# Patient Record
Sex: Female | Born: 1980 | Race: Black or African American | Hispanic: No | Marital: Single | Smoking: Current every day smoker
Health system: Southern US, Community
[De-identification: ages and names within clinical notes are randomized; demographics above are authoritative.]

---

## 2014-12-01 ENCOUNTER — Emergency Department (HOSPITAL_COMMUNITY)
Admission: EM | Admit: 2014-12-01 | Discharge: 2014-12-01 | Disposition: A | Discharge status: Home / Self Care | Payer: BC Managed Care – PPO | Attending: Emergency Medicine | Admitting: Emergency Medicine

## 2014-12-01 ENCOUNTER — Emergency Department (HOSPITAL_COMMUNITY): Payer: BC Managed Care – PPO

## 2014-12-01 ENCOUNTER — Encounter (HOSPITAL_COMMUNITY): Payer: Self-pay | Admitting: *Deleted

## 2014-12-01 DIAGNOSIS — R062 Wheezing: Secondary | ICD-10-CM | POA: Diagnosis present

## 2014-12-01 DIAGNOSIS — J069 Acute upper respiratory infection, unspecified: Secondary | ICD-10-CM | POA: Insufficient documentation

## 2014-12-01 DIAGNOSIS — Z720 Tobacco use: Secondary | ICD-10-CM | POA: Diagnosis not present

## 2014-12-01 MED ORDER — IPRATROPIUM BROMIDE 0.02 % IN SOLN
0.5000 mg | RESPIRATORY_TRACT | Status: AC
  Administered 2014-12-01: 0.5 mg via RESPIRATORY_TRACT
  Filled 2014-12-01 (×2): qty 2.5

## 2014-12-01 MED ORDER — PHENYLEPHRINE-DM-GG-APAP 5-10-200-325 MG PO CAPS: 2.0000 | ORAL_CAPSULE | Freq: Three times a day (TID) | ORAL | Status: AC

## 2014-12-01 MED ORDER — ALBUTEROL SULFATE HFA 108 (90 BASE) MCG/ACT IN AERS: 2.0000 | INHALATION_SPRAY | RESPIRATORY_TRACT | Status: AC | PRN

## 2014-12-01 MED ORDER — ALBUTEROL SULFATE (2.5 MG/3ML) 0.083% IN NEBU
5.0000 mg | INHALATION_SOLUTION | Freq: Once | RESPIRATORY_TRACT | Status: AC
  Administered 2014-12-01: 5 mg via RESPIRATORY_TRACT
  Filled 2014-12-01: qty 6

## 2014-12-01 NOTE — ED Provider Notes (Signed)
CSN: [id number]     Arrival date & time 12/01/14  1013 History   First MD Initiated Contact with Patient 12/01/14 1028     Chief Complaint  Patient presents with  . Wheezing  . Cough   (Consider location/radiation/quality/duration/timing/severity/associated sxs/prior Treatment) HPI  Kelly Marquez is a 34 year old female presenting with report of cough and wheezing. She states she first noticed the sore throat runny nose and nasal congestion 5 days ago. The following day she began having dry, nonproductive cough. The cough caused a burning sensation in her upper chest when she was coughing. She noticed 3 days ago that she is wheezing slightly. She currently is pain-free. She denies any fevers, chills, nausea, or vomiting.  History reviewed. No pertinent past medical history. History reviewed. No pertinent past surgical history. History reviewed. No pertinent family history. History  Substance Use Topics  . Smoking status: Current Every Day Smoker -- 0.50 packs/day for 10 years    Types: Cigarettes  . Smokeless tobacco: Not on file  . Alcohol Use: Yes   OB History    No data available     Review of Systems  Constitutional: Negative for fever and chills.  HENT: Positive for congestion and sore throat.   Respiratory: Positive for cough, chest tightness and wheezing. Negative for shortness of breath.   Gastrointestinal: Negative for nausea and vomiting.    Allergies  Review of patient's allergies indicates no known allergies.  Home Medications   Prior to Admission medications   Not on File   BP 142/87 mmHg  Pulse 72  Temp(Src) 98.2 F (36.8 C) (Oral)  Resp 18  SpO2 99% Physical Exam  Constitutional: She is oriented to person, place, and time. She appears well-developed and well-nourished. No distress.  HENT:  Head: Normocephalic and atraumatic.  Mouth/Throat: Oropharynx is clear and moist. No oropharyngeal exudate.  Eyes: Conjunctivae are normal.  Neck: Normal range of  motion. Neck supple.  Cardiovascular: Normal rate and regular rhythm.   Pulmonary/Chest: Effort normal. She has no decreased breath sounds. She has wheezes in the right upper field, the right middle field, the left upper field and the left middle field. She has no rhonchi. She has no rales.  Abdominal: Soft.  Neurological: She is alert and oriented to person, place, and time.  Skin: Skin is warm and dry. She is not diaphoretic.  Nursing note and vitals reviewed.   ED Course  Procedures (including critical care time) Labs Review Labs Reviewed - No data to display  Imaging Review Dg Chest 2 View  12/01/2014   CLINICAL DATA:  Initial encounter for cough and wheezing for 2 days. Smoker.  EXAM: CHEST  2 VIEW  COMPARISON:  None.  FINDINGS: Midline trachea.  Normal heart size and mediastinal contours.  Sharp costophrenic angles.  No pneumothorax.  Clear lungs.  Lateral view degraded by patient arm position. Mild S-shaped thoracolumbar spine curvature.  IMPRESSION: No active cardiopulmonary disease.   Electronically Signed   By: Jeronimo GreavesKyle  Talbot M.D.   On: 12/01/2014 11:59     EKG Interpretation None      MDM   Final diagnoses:  URI (upper respiratory infection)   34 yo with rhinorrhea, congestion, cough and wheezing  Her symptoms are consistent with viral URI.  CXR is negative for acute infiltrate. Wheezing improved after neb tx. Discussed that antibiotics are not indicated for viral infections. Pt will be discharged with symptomatic treatment. Return precautions provided. She verbalizes understanding and is agreeable with plan.  Filed Vitals:   12/01/14 1020 12/01/14 1053 12/01/14 1213  BP: 142/87  151/96  Pulse: 72  68  Temp: 98.2 F (36.8 C)    TempSrc: Oral    Resp: 18  18  SpO2: 99% 97% 100%   Meds given in ED:  Medications  albuterol (PROVENTIL) (2.5 MG/3ML) 0.083% nebulizer solution 5 mg (5 mg Nebulization Given 12/01/14 1053)  ipratropium (ATROVENT) nebulizer solution 0.5 mg  (0.5 mg Nebulization Given 12/01/14 1053)    Discharge Medication List as of 12/01/2014 12:08 PM    START taking these medications   Details  albuterol (PROVENTIL HFA;VENTOLIN HFA) 108 (90 BASE) MCG/ACT inhaler Inhale 2 puffs into the lungs every 4 (four) hours as needed for wheezing or shortness of breath., Starting 12/01/2014, Until Discontinued, Print    Phenylephrine-DM-GG-APAP (MUCINEX FAST-MAX) 5-10-200-325 MG CAPS Take 2 capsules by mouth 3 (three) times daily., Starting 12/01/2014, Until Discontinued, Print           Harle Battiest, NP 12/02/14 1215  Toy Cookey, MD 12/05/14 2020

## 2014-12-01 NOTE — ED Notes (Signed)
RT at bedside.

## 2014-12-01 NOTE — Discharge Instructions (Signed)
Please follow the directions provided. Use the resources guide provided to establish care with a primary care doctor for follow-up. Please use the albuterol inhaler 2 puffs every 4 hours as needed for cough and wheezing. Please use the multi symptom cold medicine to help with cough, congestion. Don't hesitate to return for any new, worsening, or concerning symptoms.   SEEK IMMEDIATE MEDICAL CARE IF:  You have a fever.  You develop severe or persistent headache, ear pain, sinus pain, or chest pain.  You develop wheezing, a prolonged cough, cough up blood, or have a change in your usual mucus (if you have chronic lung disease).  You develop sore muscles or a stiff neck.   Emergency Department Resource Guide 1) Find a Doctor and Pay Out of Pocket Although you won't have to find out who is covered by your insurance plan, it is a good idea to ask around and get recommendations. You will then need to call the office and see if the doctor you have chosen will accept you as a new patient and what types of options they offer for patients who are self-pay. Some doctors offer discounts or will set up payment plans for their patients who do not have insurance, but you will need to ask so you aren't surprised when you get to your appointment.  2) Contact Your Local Health Department Not all health departments have doctors that can see patients for sick visits, but many do, so it is worth a call to see if yours does. If you don't know where your local health department is, you can check in your phone book. The CDC also has a tool to help you locate your state's health department, and many state websites also have listings of all of their local health departments.  3) Find a Walk-in Clinic If your illness is not likely to be very severe or complicated, you may want to try a walk in clinic. These are popping up all over the country in pharmacies, drugstores, and shopping centers. They're usually staffed by nurse  practitioners or physician assistants that have been trained to treat common illnesses and complaints. They're usually fairly quick and inexpensive. However, if you have serious medical issues or chronic medical problems, these are probably not your best option.  No Primary Care Doctor: - Call Health Connect at  [phone] - they can help you locate a primary care doctor that  accepts your insurance, provides certain services, etc. - Physician Referral Service- [phone]  Chronic Pain Problems: Organization         Address  Phone   Notes  [facility]  [phone] Patients need to be referred by their primary care doctor.   Medication Assistance: Organization         Address  Phone   Notes  [facility] [address] [place], [place] [phone] --Must be a resident of [facility] [place] [facility] [place] -- Must have NO insurance coverage whatsoever (no Medicaid/ Medicare, etc.) -- The pt. MUST have a primary care doctor that directs their care regularly and follows them in the community   MedAssist  [phone]   [employer]  [phone]    Agencies that provide inexpensive medical care: Organization         Address  Phone   Notes  [facility]  [phone]   [facility]    [phone]   [facility]  [facility] [place], [place] [address] [phone]   [facility] [address], [place] [phone]   Planned Parenthood    [phone]   [facility]    [phone]   [facility] [address], [place] Phone:  [phone], Fax:  [phone] Hours of Operation:  9 am - 6 pm, M-F.  Also accepts Medicaid/Medicare and self-pay.  [facility] for [place] [address], [address], [place] Phone: [phone], Fax: [phone]. Hours of Operation:  8:30 am -  5:30 pm, M-F.  Also accepts Medicaid and self-pay.  [facility] [place] [address] [place] Phone: [phone]   [place], [place], [place] [phone], Ext. [phone] Mondays & Thursdays: 7-9 AM.  First 15 patients are seen on a first come, first serve basis.    [place] Providers:  Organization         Address  Phone   Notes  [facility] [address], [place] [phone] Also accepts self-pay patients.  [facility] [address], [place]  [phone]   [place], [address], [place] [phone]   [facility] [address], [place] [phone]   Lucianne Lei [address], [place]   [phone] Only accepts [place] Access [place] patients after they have their name applied to their card.   Self-Pay (no insurance) in [facility] [place] [facility]:  Organization         Address  Phone   Notes  Sickle Cell Patients, [facility] Internal Medicine [place] [phone]   [facility] [place] [phone]   [facility] [place]  [place], [place], [place] [phone]   [facility]/Dr. Osei-Bonsu  [address] [place] or [address], [place] [phone] Phone number for both [place] and [place] locations is the same.  [facility]gent [facility] and [facility] [address] [place] [phone]   [facility] [address] [place] or [address] [phone]   [facility] [address] [place] [phone], phone; [phone], fax Sees patients 1st and 3rd Saturday of every month.  Must not qualify for public or private insurance (i.e. Medicaid, Medicare, [place] Health Choice,  Veterans' Benefits)  Household income should be no more than 200% of the poverty level The clinic cannot treat you if you are pregnant or think you are pregnant  Sexually transmitted diseases are not treated at the clinic.    Dental Care: Organization         Address  Phone  Notes  [facility] Department of [facility] [phone] Accepts children up to age 5 who are enrolled in [place] or [place]; pregnant women with a Medicaid card; and children who have applied for Medicaid or [place] Health Choice, but were declined, whose parents can pay a reduced fee at time of service.  [facility] Department of [facility]  [address], [place] [phone] Accepts children up to age 38 who are enrolled in  Medicaid or [place] Health Choice; pregnant women with a Medicaid card; and children who have applied for Medicaid or [place] Health Choice, but were declined, whose parents can pay a reduced fee at time of service.  [facility] Adult [facility] Access PROGRAM  [place] [phone] Patients are seen by appointment only. Walk-ins are not accepted. [place] will see patients 41 years of age and older. Monday - Tuesday (8am-5pm) Most Wednesdays (8:30-5pm) $30 per visit, cash only  [facility] Adult Dental Access PROGRAM  [address], [facility] [phone] Patients are seen by appointment only. Walk-ins are not accepted. [place] will see patients 40 years of age and older. One Wednesday Evening (Monthly: Volunteer Based).  $30 per visit, cash only  [place]  [phone] for adults; Children under age 6, call Graduate Pediatric Dentistry at [phone]. Children aged 37-14, please call [phone] to request a pediatric application.  Dental services are provided in all areas of dental care including fillings, crowns and bridges, complete and  partial dentures, implants, gum treatment, root canals, and extractions. Preventive care is also provided. Treatment is provided to both adults and children. Patients are selected via a lottery and there is often a waiting list.   [facility] [address], [place]  [phone] [email or web address]   [facility] [address] [place], [place] [phone], Ext. [phone] Second and Fourth Thursday of each month, opens at 6:30 AM; Clinic ends at 9 AM.  Patients are seen on a first-come first-served basis, and a limited number are seen during each clinic.   [facility]  [address] Hillard Danker [place], [place] [phone]   Eligibility Requirements You must have lived in [place], [place], or [place] counties for at least the last three months.   You cannot be eligible for state or federal sponsored [employer], including [employer], [place], or [employer].   You generally cannot be eligible for healthcare insurance through your employer.    How to apply: Eligibility screenings are held every Tuesday and Wednesday afternoon from 1:00 pm until 4:00 pm. You do not need an appointment for the interview!  [facility] [address] [place], [place]   [place]  [place] Department  [place]  [phone]    Behavioral Health Resources in the Community: Intensive Outpatient Programs Organization         Address  Phone  Notes  [facility] [place]. [address], [place], [place] [phone]   [facility] Outpatient [address] [place], [place]   ADS: Alcohol & Drug Svcs [address], [place], [place]   [place] [address],  [place], [place] or [phone]   Substance Abuse Resources Organization          Address  Phone  Notes  Alcohol and Drug Services  [phone]   [place]  [phone]   [facility]   Chinita Pester  [phone]   Residential & Outpatient Substance Abuse Program  [phone]   Psychological Services Organization         Address  Phone  Notes  [facility]  [place]  [phone]   [place] [address] [place] or  [phone]    Mobile Crisis Teams Organization         Address  Phone  Notes  [facility], Mobile Crisis Care Unit  [phone]   [facility]  [address] [place], [place] [phone]   [facility] [address] [place] [phone]    Self-Help/Support Groups Organization         Address  Phone             Notes  Mental Health Assoc. of  - variety of support groups  [phone]- I7437963 Call for more information  Narcotics Anonymous (NA), [facility] [address], [employer] [place]  2 meetings at this location   Statistician         Address  Phone  Notes  [facility] [address],    [place]  [phone]   [facility]  [address], [place] [address], [place], [place] [phone]   [facility] [address] [place], [place] [phone] Admissions: 8am-3pm M-F  [facility] [address]    [place], [place] [phone]   [facility] [address] [place], [place], [place] [phone]   [facility] [address],  [place], [place] [phone]   [facility] - Intensive Outpatient [address], [place], [place] [phone]   [facility] (Addiction Recovery Care [facility]oc[facility] [address] [place].,  [place], [place] [phone] or [phone]   [facility] (RTS) [address], [place], [place]  [phone] Accepts Medicaid  [facility] [place] [address],  [place] [phone] Substance Abuse/Addiction Treatment   [facility] Organization         Address  Phone  Notes  [facility]  [phone]   Angie Fava, PhD [address] Ervin Knack [place], [place]   [phone] or [phone]   [facility]   [address] [place], [place] [phone]   [facility] [address] [place], [place] [phone] Insurance/Medicaid/sponsorship through [facility] and Families [address]                                    [place], [place] [phone] Therapy/tele-psych/case  [facility] [address], [place] [phone]    Dr. Lolly Mustache  [phone]   [facility]  United Way [facility] Dept. 1) [address], [place] 2) [address] [place] 3)  [address], [place] [phone]  [phone]   [facility] Child Abuse Hotline [phone] or 7571391090 (After Hours)

## 2014-12-01 NOTE — ED Notes (Signed)
Pt reports some relief post treatment.

## 2014-12-01 NOTE — ED Notes (Signed)
Pt reports current daily cigarette smoker, sore throat on Tuesday, cough starting on Wednesday, wheezing since Friday. Denies pain. Slight wheezing heard. No hx of asthma. Able to speak in full sentences.

## 2016-03-27 IMAGING — CR DG CHEST 2V
2 series · 2 of 2 positions shown · non-contrast
Comparison: None.

CLINICAL DATA: Initial encounter for cough and wheezing for 2 days.
Smoker.

EXAM:
CHEST  2 VIEW

[w chest pa]
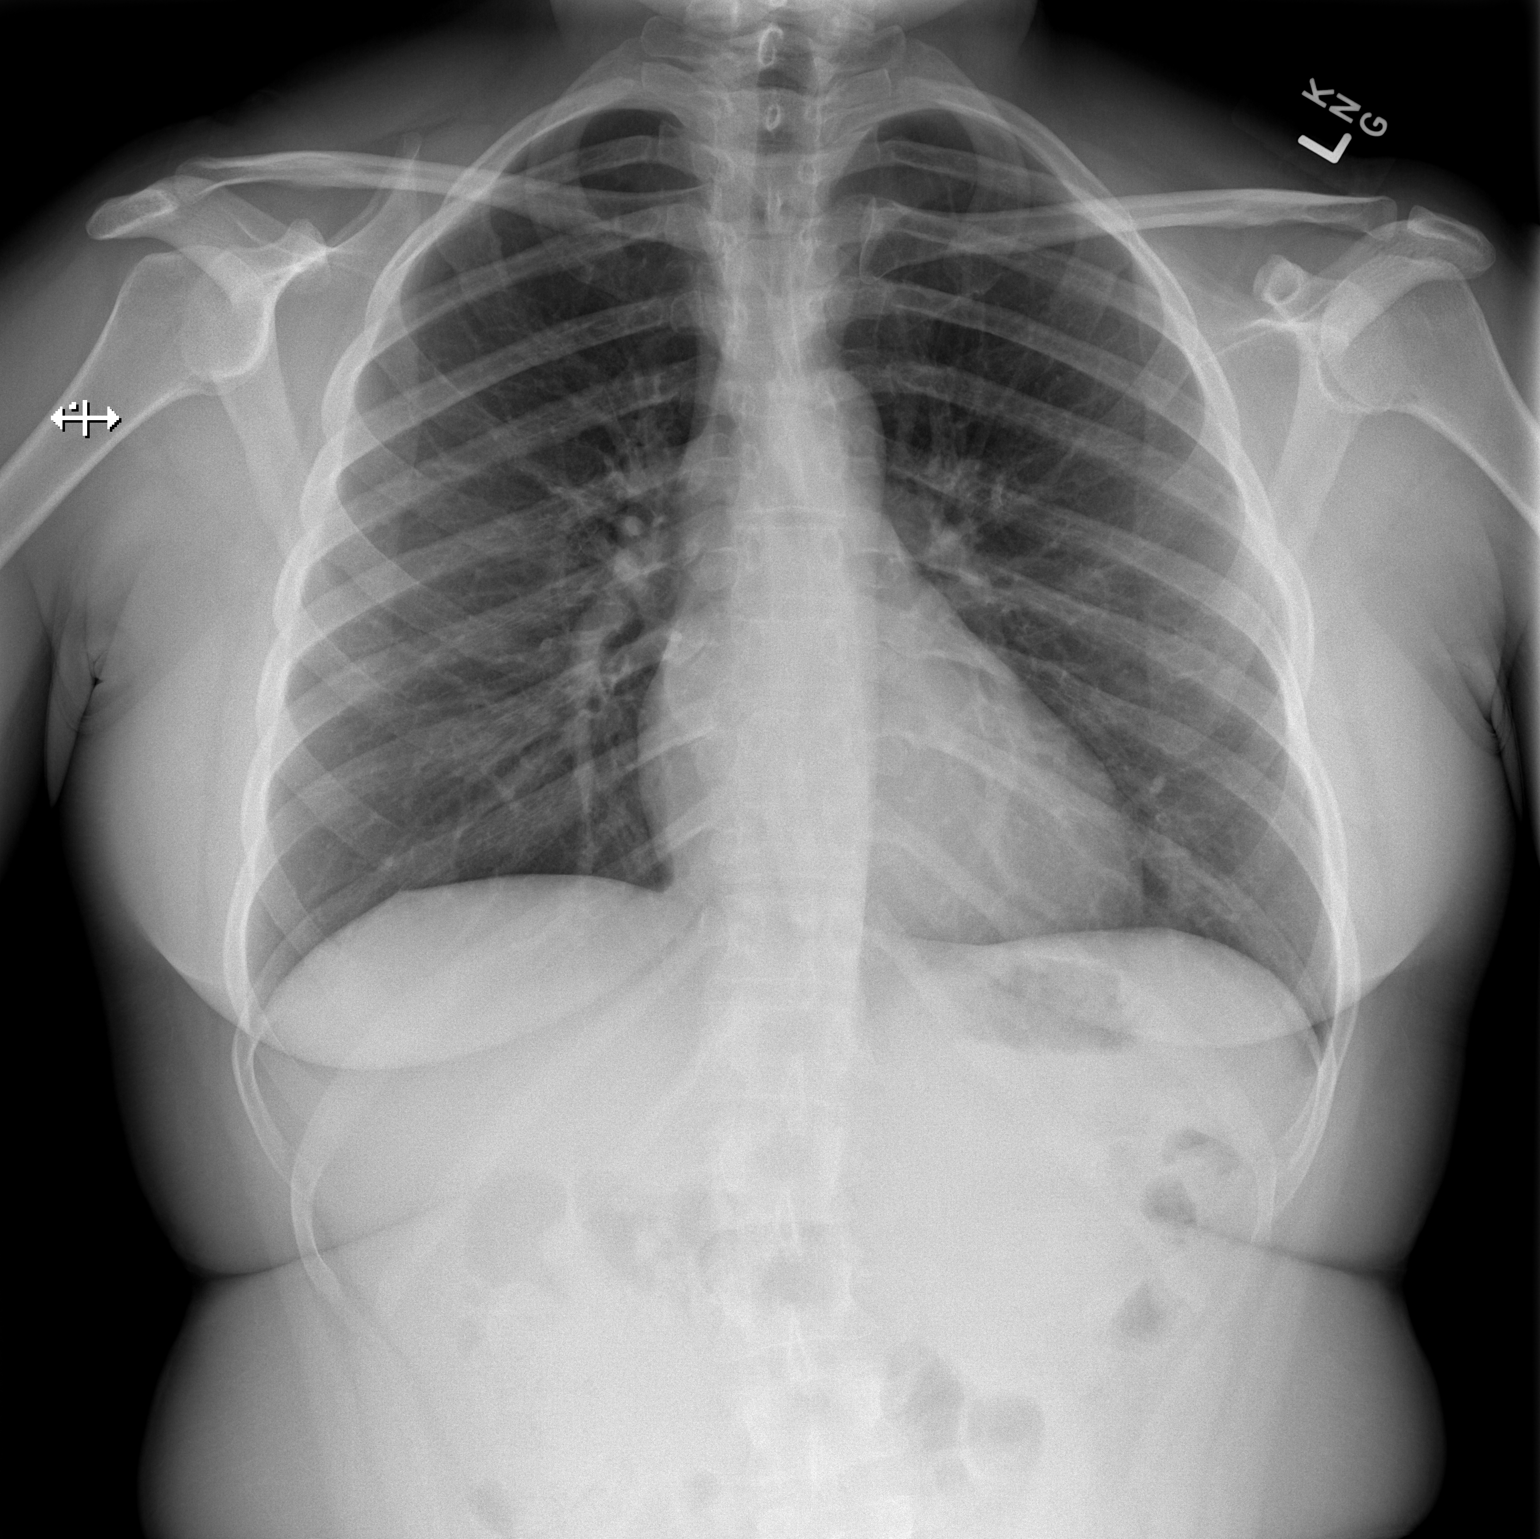

[w chest lat]
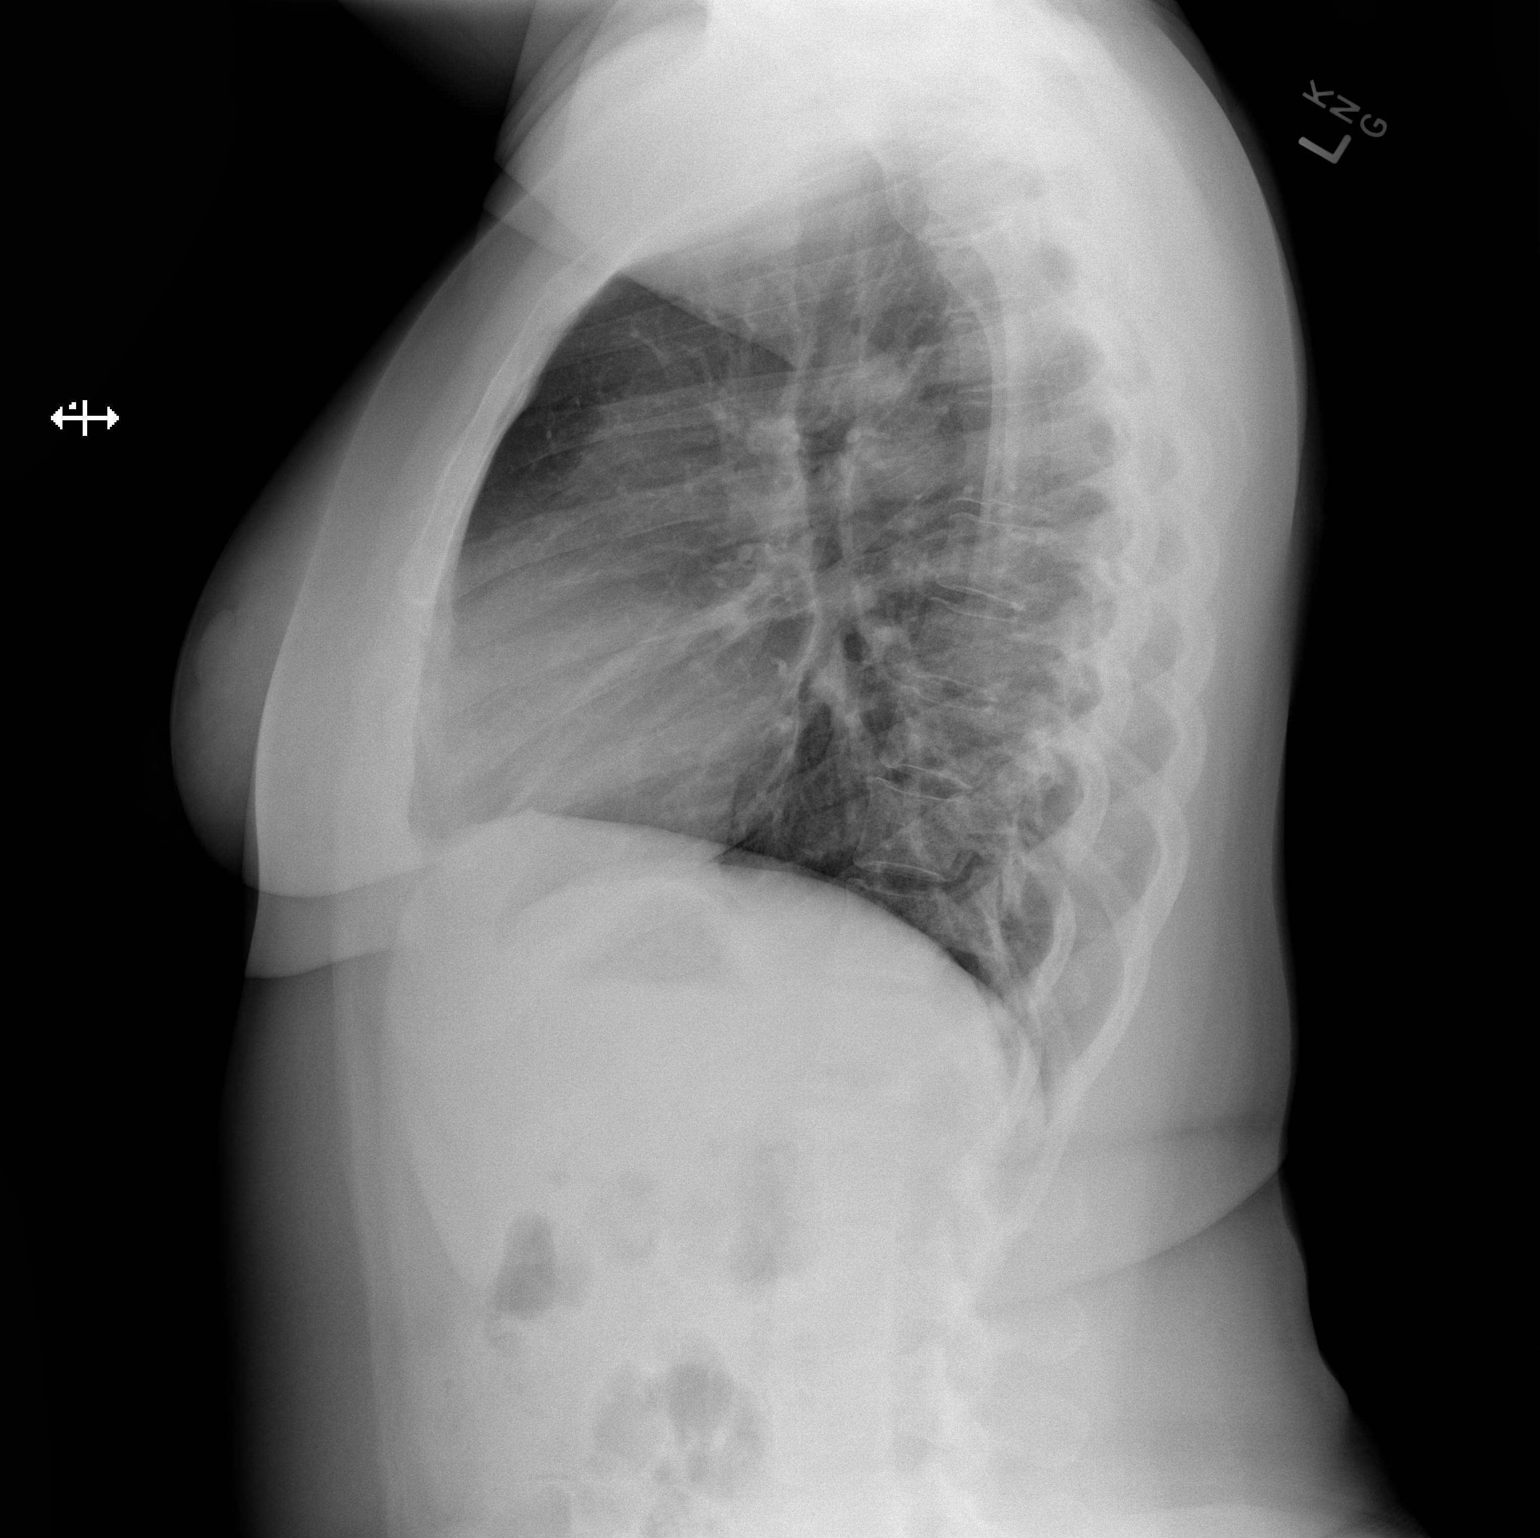

[2 of 2 positions shown; findings below may reference images not displayed]

FINDINGS: Midline trachea.  Normal heart size and mediastinal contours.

Sharp costophrenic angles.  No pneumothorax.  Clear lungs.

Lateral view degraded by patient arm position. Mild S-shaped
thoracolumbar spine curvature.
IMPRESSION: No active cardiopulmonary disease.
